# Patient Record
Sex: Female | Born: 1995 | Race: White | Hispanic: No | Marital: Single | Smoking: Current every day smoker
Health system: Southern US, Community
[De-identification: ages and names within clinical notes are randomized; demographics above are authoritative.]

## PROBLEM LIST (undated history)

## (undated) DIAGNOSIS — N390 Urinary tract infection, site not specified: Secondary | ICD-10-CM

## (undated) HISTORY — DX: Urinary tract infection, site not specified: N39.0

---
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
## 2016-07-27 ENCOUNTER — Emergency Department
Admission: EM | Admit: 2016-07-27 | Discharge: 2016-07-27 | Disposition: A | Discharge status: Home / Self Care | Payer: Self-pay | Attending: Emergency Medicine | Admitting: Emergency Medicine

## 2016-07-27 DIAGNOSIS — Z79899 Other long term (current) drug therapy: Secondary | ICD-10-CM | POA: Insufficient documentation

## 2016-07-27 DIAGNOSIS — J020 Streptococcal pharyngitis: Secondary | ICD-10-CM | POA: Insufficient documentation

## 2016-07-27 DIAGNOSIS — F17210 Nicotine dependence, cigarettes, uncomplicated: Secondary | ICD-10-CM | POA: Insufficient documentation

## 2016-07-27 LAB — POCT RAPID STREP A: STREPTOCOCCUS, GROUP A SCREEN (DIRECT): NEGATIVE

## 2016-07-27 MED ORDER — ACETAMINOPHEN 325 MG PO TABS
650.0000 mg | ORAL_TABLET | Freq: Once | ORAL | Status: AC | PRN
  Administered 2016-07-27: 650 mg via ORAL
  Filled 2016-07-27: qty 2

## 2016-07-27 MED ORDER — AMOXICILLIN 500 MG PO TABS: 500.0000 mg | ORAL_TABLET | Freq: Two times a day (BID) | ORAL | 0 refills | Status: AC

## 2016-07-27 MED ORDER — AMOXICILLIN 500 MG PO CAPS
500.0000 mg | ORAL_CAPSULE | Freq: Once | ORAL | Status: AC
  Administered 2016-07-27: 500 mg via ORAL

## 2016-07-27 NOTE — ED Provider Notes (Signed)
[facility] Emergency Department Provider Note  ____________________________________________  Time seen: Approximately 9:08 PM  I have reviewed the triage vital signs and the nursing notes.   HISTORY  Chief Complaint Sore Throat    HPI Brooke Ross is a 21 y.o. female presents to the emergency department with pharyngitis, headache, abdominal discomfort, anterior neck pain, fever and "white patches" on the back of her throat. Patient denies associated rhinorrhea, congestion, nonproductive cough, chest pain and chest tightness. Patient is managing her own secretions and speaking in complete sentences.   History reviewed. No pertinent past medical history.  Patient Active Problem List   Diagnosis Date Noted  . Open knee wound 06/24/2015    Past Surgical History:  Procedure Laterality Date  . IRRIGATION AND DEBRIDEMENT KNEE Right 06/24/2015   Procedure: IRRIGATION AND DEBRIDEMENT Open Wound Right KNEE with Local Tissue Rearrangement for Wound Closure;  Surgeon: Nadara Mustard, MD;  Location: [facility] OR;  Service: Orthopedics;  Laterality: Right;  . KNEE ARTHROSCOPY Right 06/24/2015   Procedure: RIGHT KNEE ARTHROSCOPIC WASHOUT;  Surgeon: Nadara Mustard, MD;  Location: [facility] OR;  Service: Orthopedics;  Laterality: Right;    Prior to Admission medications   Medication Sig Start Date End Date Taking? Authorizing Provider  amoxicillin (AMOXIL) 500 MG tablet Take 1 tablet (500 mg total) by mouth 2 (two) times daily. 07/27/16 08/06/16  Orvil Feil, PA-C  doxycycline (VIBRAMYCIN) 50 MG capsule Take 2 capsules (100 mg total) by mouth 2 (two) times daily. 06/24/15   Nadara Mustard, MD  ondansetron (ZOFRAN ODT) 4 MG disintegrating tablet Take 1 tablet (4 mg total) by mouth every 8 (eight) hours as needed for nausea or vomiting. 12/26/14   Rebecka Apley, MD  oxyCODONE-acetaminophen (ROXICET) 5-325 MG tablet Take 1 tablet by mouth every 4 (four) hours as needed for severe pain.  06/24/15   Nadara Mustard, MD    Allergies Patient has no known allergies.  No family history on file.  Social History Social History  Substance Use Topics  . Smoking status: Current Every Day Smoker  . Smokeless tobacco: Never Used  . Alcohol use No     Review of Systems  Constitutional: Patient has had fever Eyes: No visual changes. No discharge ENT: Patient has pharyngitis  Cardiovascular: no chest pain. Respiratory: no cough. No SOB. Gastrointestinal: No abdominal pain.  No nausea, no vomiting.  No diarrhea.  No constipation. Musculoskeletal: Negative for musculoskeletal pain. Skin: Negative for rash, abrasions, lacerations, ecchymosis. Neurological: Patient has headache, no focal weakness or numbness.  ____________________________________________   PHYSICAL EXAM:  VITAL SIGNS: ED Triage Vitals  Enc Vitals Group     BP 07/27/16 1914 134/76     Pulse Rate 07/27/16 1914 85     Resp 07/27/16 1914 16     Temp 07/27/16 1914 (!) 103.1 F (39.5 C)     Temp Source 07/27/16 1914 Oral     SpO2 07/27/16 1914 99 %     Weight 07/27/16 1912 220 lb (99.8 kg)     Height 07/27/16 1912 6' (1.829 m)     Head Circumference --      Peak Flow --      Pain Score 07/27/16 1912 8     Pain Loc --      Pain Edu? --      Excl. in GC? --      Constitutional: Alert and oriented. Well appearing and in no acute distress. Eyes: Conjunctivae are normal.  PERRL. EOMI. Head: Atraumatic. ENT:      Ears: Tympanic membranes are pearly bilaterally.      Nose: No congestion/rhinnorhea.      Mouth/Throat: Mucous membranes are moist.  Posterior pharynx is erythematous with bilateral tonsillar hypertrophy and exudate. Hematological/Lymphatic/Immunilogical: Palpable cervical lymphadenopathy. Cardiovascular: Normal rate, regular rhythm. Normal S1 and S2.  Good peripheral circulation. Respiratory: Normal respiratory effort without tachypnea or retractions. Lungs CTAB. Good air entry to the bases  with no decreased or absent breath sounds. Gastrointestinal: Bowel sounds 4 quadrants. Soft and nontender to palpation. No guarding or rigidity. No palpable masses. No distention. No CVA tenderness. Musculoskeletal: Full range of motion to all extremities. No gross deformities appreciated. Neurologic:  Normal speech and language. No gross focal neurologic deficits are appreciated.  Skin:  Skin is warm, dry and intact. No rash noted. Psychiatric: Mood and affect are normal. Speech and behavior are normal. Patient exhibits appropriate insight and judgement.   ____________________________________________   LABS (all labs ordered are listed, but only abnormal results are displayed)  Labs Reviewed  CULTURE, GROUP A STREP [facility]  POCT RAPID STREP A   ____________________________________________  EKG   ____________________________________________  RADIOLOGY   No results found.  ____________________________________________    PROCEDURES  Procedure(s) performed:    Procedures    Medications  acetaminophen (TYLENOL) tablet 650 mg (650 mg Oral Given 07/27/16 1919)     ____________________________________________   INITIAL IMPRESSION / ASSESSMENT AND PLAN / ED COURSE  Pertinent labs & imaging results that were available during my care of the patient were reviewed by me and considered in my medical decision making (see chart for details).  Review of the [place] CSRS was performed in accordance of the [facility] prior to dispensing any controlled drugs.    Assessment and plan Strep pharyngitis Patient's diagnosis is consistent with Strep pharyngitis. Patient will be discharged home with prescriptions for amoxicillin. Patient is to follow up with primary care as needed or otherwise directed. Patient is given ED precautions to return to the ED for any worsening or new symptoms.     ____________________________________________  FINAL CLINICAL IMPRESSION(S) / ED  DIAGNOSES  Final diagnoses:  Strep pharyngitis      NEW MEDICATIONS STARTED DURING THIS VISIT:  New Prescriptions   AMOXICILLIN (AMOXIL) 500 MG TABLET    Take 1 tablet (500 mg total) by mouth 2 (two) times daily.        This chart was dictated using voice recognition software/Dragon. Despite best efforts to proofread, errors can occur which can change the meaning. Any change was purely unintentional.    Gasper LloydWoods, Agam Davenport M, PA-C 07/27/16 2115    Sharman CheekStafford, Phillip, MD 07/28/16 [phone]

## 2016-07-27 NOTE — ED Triage Notes (Addendum)
Pt presents to ED with c/o sore throat since Thursday. No N/V/D, unsure of fever, did not take temp at home. While performing throat swab for Strep test, Lenord FellersYessica, EDT reports pt's tonsils are swollen with obvious "white patches".

## 2016-07-29 LAB — CULTURE, GROUP A STREP (THRC)

## 2016-12-13 ENCOUNTER — Other Ambulatory Visit: Payer: Self-pay

## 2016-12-13 ENCOUNTER — Emergency Department: Payer: Self-pay

## 2016-12-13 ENCOUNTER — Encounter: Payer: Self-pay | Admitting: Emergency Medicine

## 2016-12-13 ENCOUNTER — Emergency Department
Admission: EM | Admit: 2016-12-13 | Discharge: 2016-12-13 | Disposition: A | Discharge status: Home / Self Care | Payer: Self-pay | Attending: Emergency Medicine | Admitting: Emergency Medicine

## 2016-12-13 DIAGNOSIS — F17200 Nicotine dependence, unspecified, uncomplicated: Secondary | ICD-10-CM | POA: Insufficient documentation

## 2016-12-13 DIAGNOSIS — J181 Lobar pneumonia, unspecified organism: Secondary | ICD-10-CM | POA: Insufficient documentation

## 2016-12-13 DIAGNOSIS — J189 Pneumonia, unspecified organism: Secondary | ICD-10-CM

## 2016-12-13 MED ORDER — CEFTRIAXONE SODIUM 1 G IJ SOLR
1.0000 g | Freq: Once | INTRAMUSCULAR | Status: AC
  Administered 2016-12-13: 1 g via INTRAMUSCULAR
  Filled 2016-12-13: qty 10

## 2016-12-13 MED ORDER — BENZONATATE 200 MG PO CAPS: 200.0000 mg | ORAL_CAPSULE | Freq: Three times a day (TID) | ORAL | 0 refills | Status: DC | PRN

## 2016-12-13 MED ORDER — PSEUDOEPH-BROMPHEN-DM 30-2-10 MG/5ML PO SYRP
10.0000 mL | ORAL_SOLUTION | Freq: Four times a day (QID) | ORAL | 0 refills | Status: DC | PRN
Start: 2016-12-13 — End: 2020-08-06

## 2016-12-13 MED ORDER — AZITHROMYCIN 250 MG PO TABS: ORAL_TABLET | ORAL | 0 refills | Status: DC

## 2016-12-13 NOTE — ED Provider Notes (Signed)
[facility] Emergency Department Provider Note  ____________________________________________  Time seen: Approximately 6:52 PM  I have reviewed the triage vital signs and the nursing notes.   HISTORY  Chief Complaint Cough    HPI Brooke Ross is a 21 y.o. female who presents the emergency department for week of fevers and chills, cough, chest congestion.  Patient reports that she has had a productive cough times 1 week.  She has had some mild nasal congestion at the start of the illness but denies any headache, visual changes, nasal congestion, sore throat, abdominal pain, nausea or vomiting at this time.  Patient has taken cough drops for this cough but no other medications at home.  She denies any chest pain, shortness of breath at this time.  No other complaints at this time.  History reviewed. No pertinent past medical history.  Patient Active Problem List   Diagnosis Date Noted  . Open knee wound 06/24/2015    Past Surgical History:  Procedure Laterality Date  . IRRIGATION AND DEBRIDEMENT KNEE Right 06/24/2015   Procedure: IRRIGATION AND DEBRIDEMENT Open Wound Right KNEE with Local Tissue Rearrangement for Wound Closure;  Surgeon: Nadara MustardMarcus V Duda, MD;  Location: [facility] OR;  Service: Orthopedics;  Laterality: Right;  . KNEE ARTHROSCOPY Right 06/24/2015   Procedure: RIGHT KNEE ARTHROSCOPIC WASHOUT;  Surgeon: Nadara MustardMarcus V Duda, MD;  [facility];  Service: Orthopedics;  Laterality: Right;    Prior to Admission medications   Medication Sig Start Date End Date Taking? Authorizing Provider  azithromycin (ZITHROMAX Z-PAK) 250 MG tablet Take 2 tablets (500 mg) on  Day 1,  followed by 1 tablet (250 mg) once daily on Days 2 through 5. 12/13/16   Keyshawna Prouse, Delorise RoyalsJonathan D, PA-C  benzonatate (TESSALON) 200 MG capsule Take 1 capsule (200 mg total) by mouth 3 (three) times daily as needed for cough. 12/13/16   Midori Dado, Delorise RoyalsJonathan D, PA-C  brompheniramine-pseudoephedrine-DM  30-2-10 MG/5ML syrup Take 10 mLs by mouth 4 (four) times daily as needed. 12/13/16   Tosha Belgarde, Delorise RoyalsJonathan D, PA-C  doxycycline (VIBRAMYCIN) 50 MG capsule Take 2 capsules (100 mg total) by mouth 2 (two) times daily. 06/24/15   Nadara Mustarduda, Marcus V, MD  ondansetron (ZOFRAN ODT) 4 MG disintegrating tablet Take 1 tablet (4 mg total) by mouth every 8 (eight) hours as needed for nausea or vomiting. 12/26/14   Rebecka ApleyWebster, Allison P, MD  oxyCODONE-acetaminophen (ROXICET) 5-325 MG tablet Take 1 tablet by mouth every 4 (four) hours as needed for severe pain. 06/24/15   Nadara Mustarduda, Marcus V, MD    Allergies Patient has no known allergies.  No family history on file.  Social History Social History   Tobacco Use  . Smoking status: Current Every Day Smoker  . Smokeless tobacco: Never Used  Substance Use Topics  . Alcohol use: No  . Drug use: No     Review of Systems  Constitutional: Positive fever/chills Eyes: No visual changes. No discharge ENT: No upper respiratory complaints. Cardiovascular: no chest pain. Respiratory: Positive cough. No SOB. Gastrointestinal: No abdominal pain.  No nausea, no vomiting.  No diarrhea.  No constipation. Musculoskeletal: Negative for musculoskeletal pain. Skin: Negative for rash, abrasions, lacerations, ecchymosis. Neurological: Negative for headaches, focal weakness or numbness. 10-point ROS otherwise negative.  ____________________________________________   PHYSICAL EXAM:  VITAL SIGNS: ED Triage Vitals  Enc Vitals Group     BP 12/13/16 1808 (!) 150/94     Pulse Rate 12/13/16 1807 85     Resp 12/13/16 1807 16  Temp 12/13/16 1807 98.7 F (37.1 C)     Temp Source 12/13/16 1807 Oral     SpO2 12/13/16 1807 97 %     Weight 12/13/16 1808 220 lb (99.8 kg)     Height 12/13/16 1808 6' (1.829 m)     Head Circumference --      Peak Flow --      Pain Score --      Pain Loc --      Pain Edu? --      Excl. in GC? --      Constitutional: Alert and oriented. Well  appearing and in no acute distress. Eyes: Conjunctivae are normal. PERRL. EOMI. Head: Atraumatic. ENT:      Ears: EACs and TMs unremarkable bilaterally.      Nose: No congestion/rhinnorhea.      Mouth/Throat: Mucous membranes are moist.  Neck: No stridor.   Hematological/Lymphatic/Immunilogical: No cervical lymphadenopathy. Cardiovascular: Normal rate, regular rhythm. Normal S1 and S2.  Good peripheral circulation. Respiratory: Normal respiratory effort without tachypnea or retractions. Lungs with rhonchi to the right middle lobe.  No wheezing or rales.  No adventitious lung sounds to the left side.Peri Jefferson air entry to the bases with no decreased or absent breath sounds. Musculoskeletal: Full range of motion to all extremities. No gross deformities appreciated. Neurologic:  Normal speech and language. No gross focal neurologic deficits are appreciated.  Skin:  Skin is warm, dry and intact. No rash noted. Psychiatric: Mood and affect are normal. Speech and behavior are normal. Patient exhibits appropriate insight and judgement.   ____________________________________________   LABS (all labs ordered are listed, but only abnormal results are displayed)  Labs Reviewed - No data to display ____________________________________________  EKG   ____________________________________________  RADIOLOGY Festus Barren Corrina Steffensen, personally viewed and evaluated these images (plain radiographs) as part of my medical decision making, as well as reviewing the written report by the radiologist.  Dg Chest 2 View  Result Date: 12/13/2016 CLINICAL DATA:  Cough for 1 week. EXAM: CHEST  2 VIEW COMPARISON:  06/23/2015 FINDINGS: The cardiomediastinal silhouette is unremarkable. Mild right middle lobe opacity may represent atelectasis or airspace disease/ pneumonia. There is no evidence of pleural effusion, pulmonary edema, pneumothorax or acute bony abnormality. IMPRESSION: Mild right middle lobe opacity  -question atelectasis versus pneumonia. Electronically Signed   By: Harmon Pier M.D.   On: 12/13/2016 18:47    ____________________________________________    PROCEDURES  Procedure(s) performed:    Procedures    Medications  cefTRIAXone (ROCEPHIN) injection 1 g (not administered)     ____________________________________________   INITIAL IMPRESSION / ASSESSMENT AND PLAN / ED COURSE  Pertinent labs & imaging results that were available during my care of the patient were reviewed by me and considered in my medical decision making (see chart for details).  Review of the [place] CSRS was performed in accordance of the [facility] prior to dispensing any controlled drugs.     Patient's diagnosis is consistent with community-acquired pneumonia.  Patient has adventitious lung sounds to the right middle lobe with consolidation on x-ray consistent with bronchitis, pneumonia.  Patient will be treated with shot of Rocephin in the Z-Pak at home.  Bromfed cough syrup and Tessalon Perles also ordered for symptom control..  She will follow-up with primary care as needed.  Patient is given ED precautions to return to the ED for any worsening or new symptoms.     ____________________________________________  FINAL CLINICAL IMPRESSION(S) / ED DIAGNOSES  Final  diagnoses:  Community acquired pneumonia of right middle lobe of lung (HCC)      NEW MEDICATIONS STARTED DURING THIS VISIT:  ED Discharge Orders        Ordered    azithromycin (ZITHROMAX Z-PAK) 250 MG tablet     12/13/16 1902    brompheniramine-pseudoephedrine-DM 30-2-10 MG/5ML syrup  4 times daily PRN     12/13/16 1902    benzonatate (TESSALON) 200 MG capsule  3 times daily PRN     12/13/16 1902          This chart was dictated using voice recognition software/Dragon. Despite best efforts to proofread, errors can occur which can change the meaning. Any change was purely unintentional.    Racheal PatchesCuthriell, Jajuan Skoog D,  PA-C 12/13/16 Elayne [id number]    Siadecki, Sebastian, MD 12/13/16 2011

## 2016-12-13 NOTE — ED Triage Notes (Signed)
Pt states that for the past week she has had cough and congestion. Pt states that she is getting some yellow/green sputum up when she coughs. Pt states that she has been using cough drops without relief. Pt states that she has not checked her temperature but does not think she has had fever. Pt in NAD at this time.

## 2018-07-02 IMAGING — CR DG CHEST 2V
1 series · 2 of 2 positions shown · non-contrast
Comparison: 06/23/2015

CLINICAL DATA: Cough for 1 week.

EXAM:
CHEST  2 VIEW

[Series 1: dg chest 2 view · 0.14mm/px · 2 of 2 slices shown]
[im 1/2]
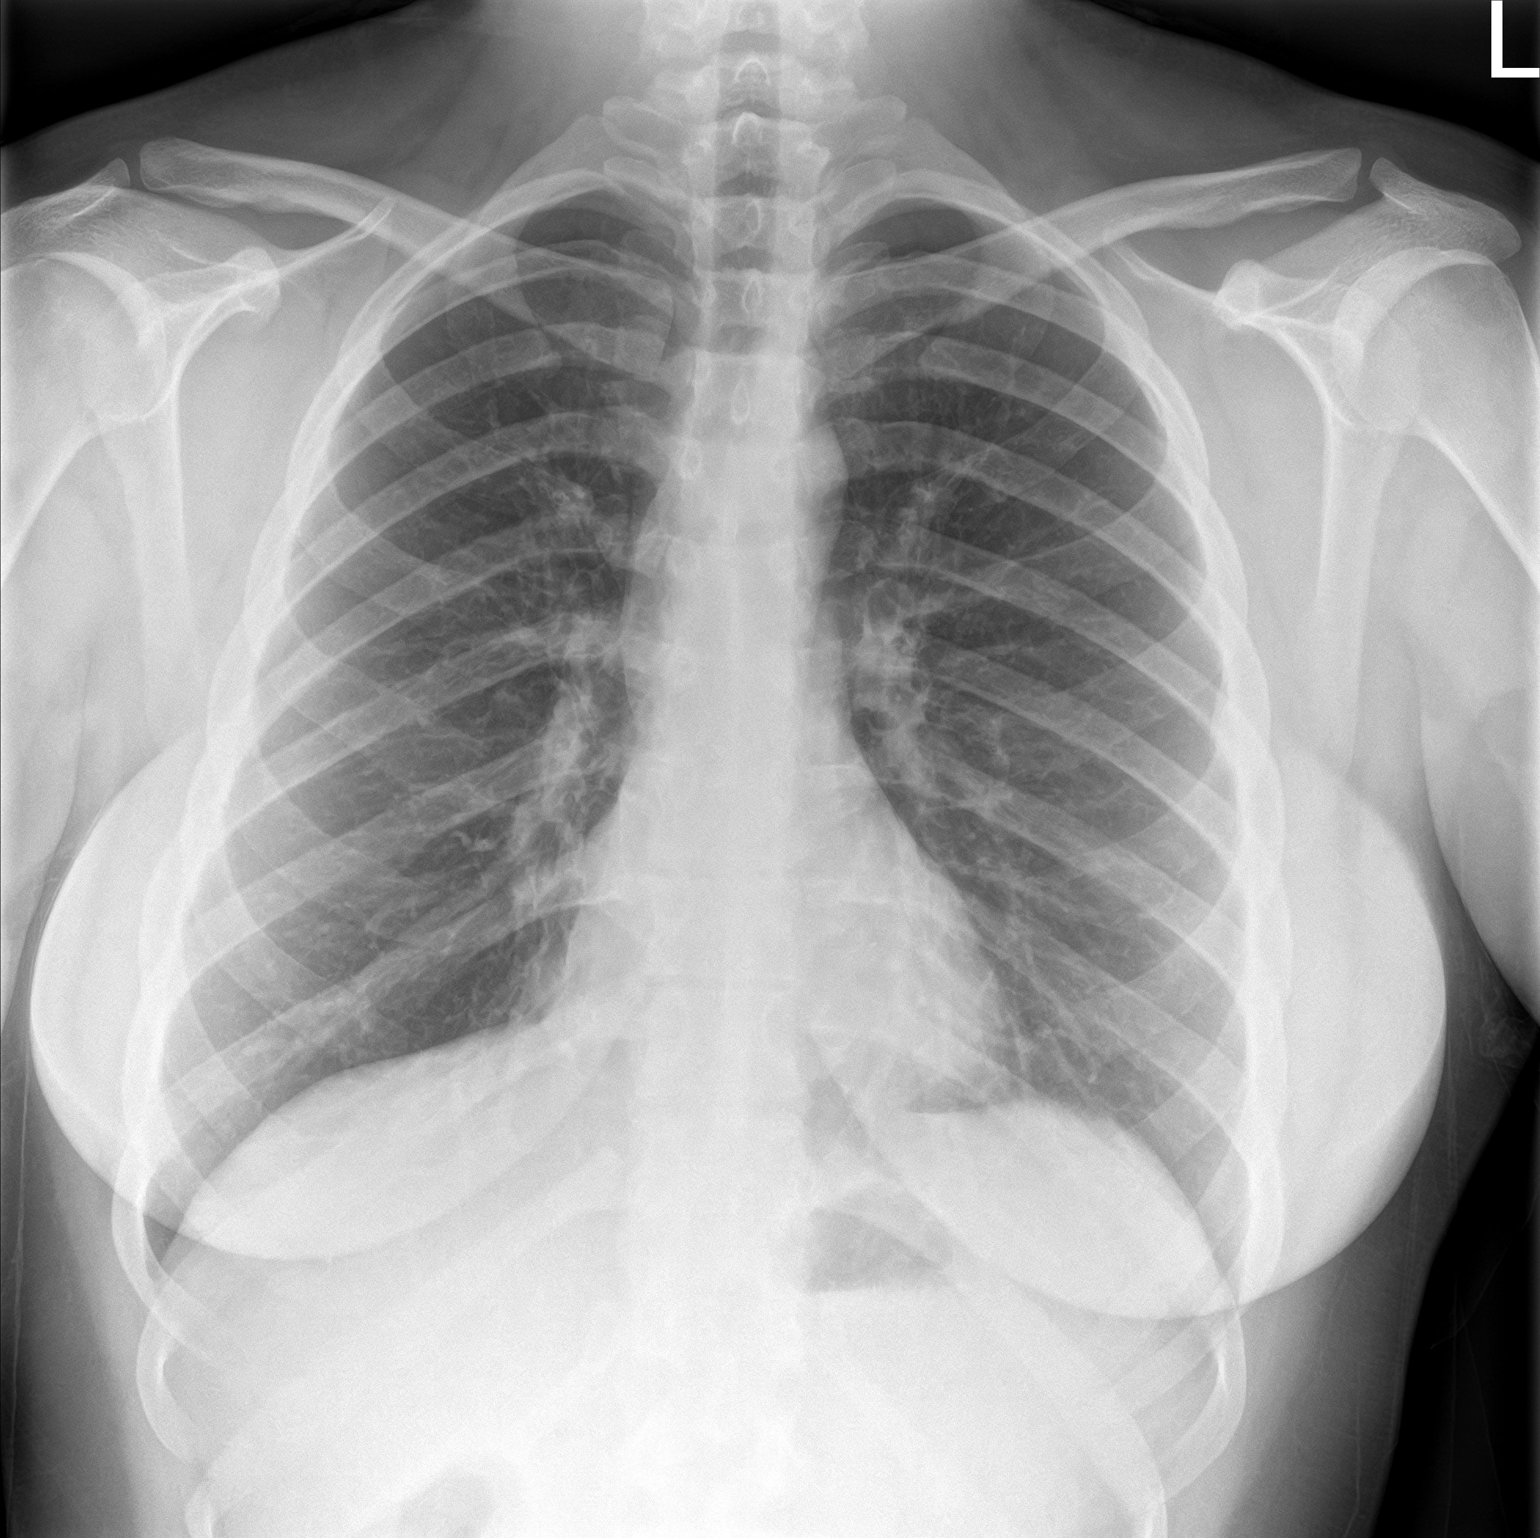
[im 2/2]
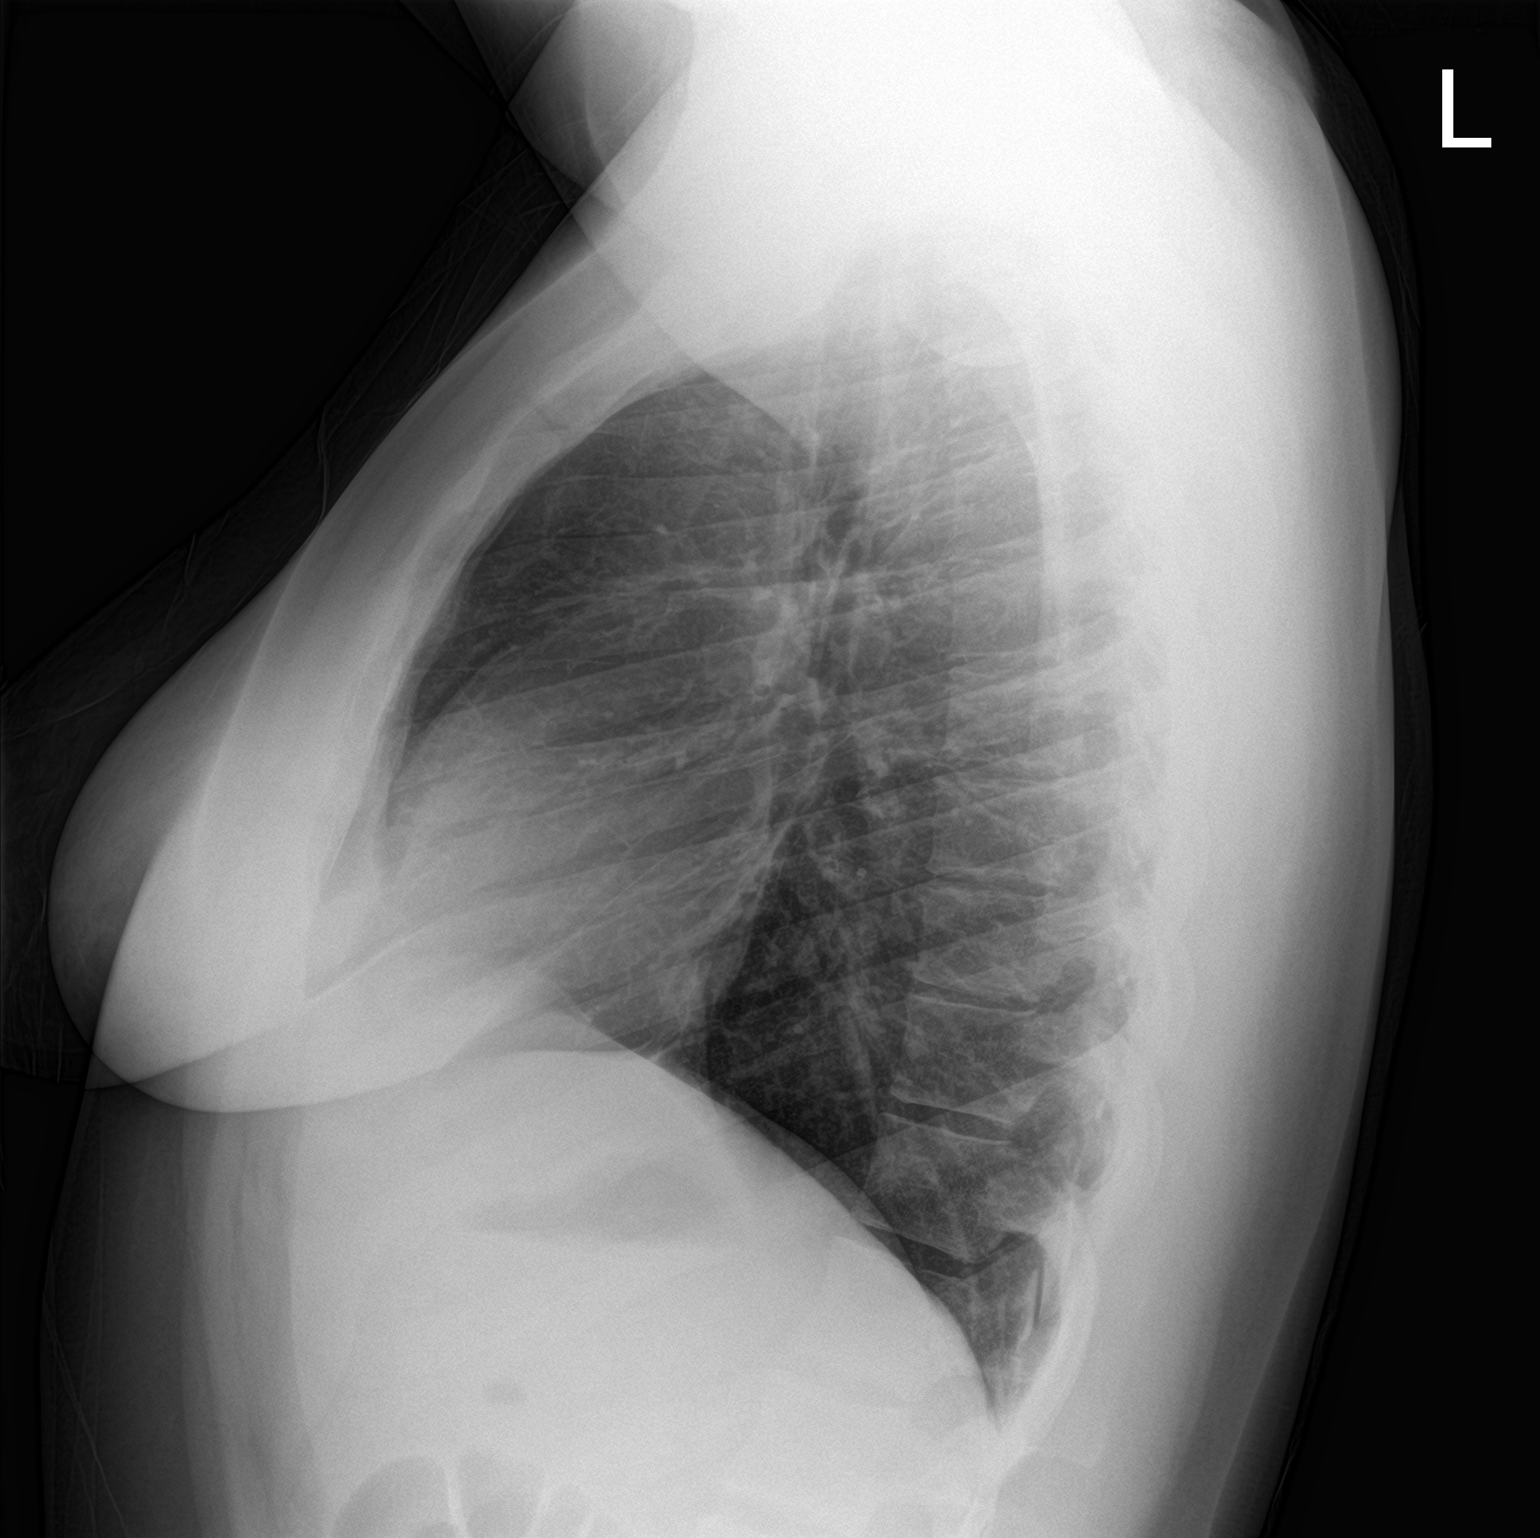

[2 of 2 positions shown; findings below may reference images not displayed]

FINDINGS: The cardiomediastinal silhouette is unremarkable.

Mild right middle lobe opacity may represent atelectasis or airspace
disease/ pneumonia.

There is no evidence of pleural effusion, pulmonary edema,
pneumothorax or acute bony abnormality.
IMPRESSION: Mild right middle lobe opacity -question atelectasis versus
pneumonia.

## 2020-06-06 ENCOUNTER — Ambulatory Visit (LOCAL_COMMUNITY_HEALTH_CENTER): Payer: Self-pay

## 2020-06-06 ENCOUNTER — Other Ambulatory Visit: Payer: Self-pay

## 2020-06-06 VITALS — BP 132/86 | Ht 71.0 in | Wt 275.5 lb

## 2020-06-06 DIAGNOSIS — Z3202 Encounter for pregnancy test, result negative: Secondary | ICD-10-CM

## 2020-06-06 LAB — PREGNANCY, URINE: Preg Test, Ur: NEGATIVE
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
## 2020-08-06 ENCOUNTER — Emergency Department
Admission: EM | Admit: 2020-08-06 | Discharge: 2020-08-06 | Disposition: A | Discharge status: Home / Self Care | Payer: Self-pay | Attending: Student in an Organized Health Care Education/Training Program | Admitting: Student in an Organized Health Care Education/Training Program

## 2020-08-06 ENCOUNTER — Other Ambulatory Visit: Payer: Self-pay

## 2020-08-06 DIAGNOSIS — F17210 Nicotine dependence, cigarettes, uncomplicated: Secondary | ICD-10-CM | POA: Insufficient documentation

## 2020-08-06 DIAGNOSIS — N911 Secondary amenorrhea: Secondary | ICD-10-CM | POA: Insufficient documentation

## 2020-08-06 DIAGNOSIS — N9489 Other specified conditions associated with female genital organs and menstrual cycle: Secondary | ICD-10-CM | POA: Insufficient documentation

## 2020-08-06 LAB — BASIC METABOLIC PANEL
Anion gap: 9 (ref 5–15)
BUN: 10 mg/dL (ref 6–20)
CO2: 27 mmol/L (ref 22–32)
Calcium: 9.5 mg/dL (ref 8.9–10.3)
Chloride: 101 mmol/L (ref 98–111)
Creatinine, Ser: 0.75 mg/dL (ref 0.44–1.00)
GFR, Estimated: >60 mL/min (ref >60)
Glucose, Bld: 82 mg/dL (ref 70–99)
Potassium: 4.2 mmol/L (ref 3.5–5.1)
Sodium: 137 mmol/L (ref 135–145)

## 2020-08-06 LAB — CBC
HCT: 40.9 % (ref 36.0–46.0)
Hemoglobin: 13.8 g/dL (ref 12.0–15.0)
MCH: 28.8 pg (ref 26.0–34.0)
MCHC: 33.7 g/dL (ref 30.0–36.0)
MCV: 85.4 fL (ref 80.0–100.0)
Platelets: 326 10*3/uL (ref 150–400)
RBC: 4.79 MIL/uL (ref 3.87–5.11)
RDW: 12.4 % (ref 11.5–15.5)
WBC: 9.3 10*3/uL (ref 4.0–10.5)
nRBC: 0 % (ref 0.0–0.2)

## 2020-08-06 LAB — HCG, QUANTITATIVE, PREGNANCY: hCG, Beta Chain, Quant, S: <1 m[IU]/mL (ref <5)

## 2020-08-06 LAB — POC URINE PREG, ED: Preg Test, Ur: NEGATIVE

## 2020-08-06 NOTE — ED Notes (Signed)
See tirage note  Presents stating her period was late    Denies any pain

## 2020-08-06 NOTE — Discharge Instructions (Addendum)
Your pregnancy labs are negative today.  Please schedule an appointment to follow-up with primary care or OB/GYN.  The information for the [facility]-door clinic is available on this discharge paperwork.  If you develop any worsening symptoms, please return to the emergency department.

## 2020-08-06 NOTE — ED Triage Notes (Signed)
Pt states she is 80 days late for her periods and home pregnancy test are negative and just wants labs drawn to make sure. Pt has no complaints

## 2020-08-06 NOTE — ED Provider Notes (Signed)
[facility] Emergency Department Provider Note  ____________________________________________   Event Date/Time   First MD Initiated Contact with Patient 08/06/20 1216     (approximate)  I have reviewed the triage vital signs and the nursing notes.   HISTORY  Chief Complaint Possible Pregnancy   HPI Ardra Kuznicki is a 25 y.o. female who reports to the ER stating she needs blood work to confirm no pregnancy. Patient states she has not had a period in the last 80 days, has had multiple negative pregnancy tests. She denies history of periods of amenorrhea. States her menstrual cycles have previously been regular intervals, but does note the last 2 cycles seemed to be much lighter than her usual. She does endorse occasional broad headaches, but states this is normal for her and has not had any recent changes. She denies any blurred vision, dizziness, nipple discharge, breast pain, abdominal pain, nausea, vomiting, dysuria or other associated symptoms. She denies any significant weight gain or weight loss, changes in diet or exercise habits. She states she has been unable to see primary care or OB regarding this concern due to financial limitations. She is not on any contraceptives.         Past Medical History:  Diagnosis Date   Urinary tract infection     Patient Active Problem List   Diagnosis Date Noted   Open knee wound 06/24/2015    Past Surgical History:  Procedure Laterality Date   IRRIGATION AND DEBRIDEMENT KNEE Right 06/24/2015   Procedure: IRRIGATION AND DEBRIDEMENT Open Wound Right KNEE with Local Tissue Rearrangement for Wound Closure;  Surgeon: Nadara Mustard, MD;  Location: [facility] OR;  Service: Orthopedics;  Laterality: Right;   KNEE ARTHROSCOPY Right 06/24/2015   Procedure: RIGHT KNEE ARTHROSCOPIC WASHOUT;  Surgeon: Nadara Mustard, MD;  Location: [facility] OR;  Service: Orthopedics;  Laterality: Right;    Prior to Admission medications   Not on File     Allergies Patient has no known allergies.  No family history on file.  Social History Social History   Tobacco Use   Smoking status: Every Day    Packs/day: 0.50    Types: Cigarettes   Smokeless tobacco: Never  Vaping Use   Vaping Use: Every day   Substances: Nicotine  Substance Use Topics   Alcohol use: Not Currently    Comment: last use 01/12/20   Drug use: No    Review of Systems Constitutional: No fever/chills Eyes: No visual changes. ENT: No sore throat. Cardiovascular: Denies chest pain. Respiratory: Denies shortness of breath. Gastrointestinal: No abdominal pain.  No nausea, no vomiting.  No diarrhea.  No constipation. Genitourinary: Negative for dysuria. +amenorrhea Musculoskeletal: Negative for back pain. Skin: Negative for rash. Neurological: +chronic headaches, negative for focal weakness or numbness.  ____________________________________________   PHYSICAL EXAM:  VITAL SIGNS: ED Triage Vitals  Enc Vitals Group     BP 08/06/20 1150 (!) 134/92     Pulse Rate 08/06/20 1150 64     Resp 08/06/20 1150 18     Temp 08/06/20 1150 97.7 F (36.5 C)     Temp Source 08/06/20 1150 Oral     SpO2 08/06/20 1150 99 %     Weight 08/06/20 1231 275 lb 9.2 oz (125 kg)     Height 08/06/20 1125 5\' 11"  (1.803 m)     Head Circumference --      Peak Flow --      Pain Score 08/06/20 1125 0  Pain Loc --      Pain Edu? --      Excl. in GC? --    Constitutional: Alert and oriented. Well appearing and in no acute distress. Eyes: Conjunctivae are normal. PERRL. EOMI. Head: Atraumatic. Nose: No congestion/rhinnorhea. Mouth/Throat: Mucous membranes are moist.  Oropharynx non-erythematous. Neck: No stridor.   Cardiovascular: Normal rate, regular rhythm. Grossly normal heart sounds.  Good peripheral circulation. Respiratory: Normal respiratory effort.  No retractions. Lungs CTAB. Gastrointestinal: Soft and nontender. No distention. No abdominal bruits. No CVA  tenderness. Musculoskeletal: No lower extremity tenderness nor edema.  No joint effusions. Neurologic:  Normal speech and language. CN II-XII grossly intact. No gross focal neurologic deficits are appreciated. No gait instability. Skin:  Skin is warm, dry and intact. No rash noted. Psychiatric: Mood and affect are normal. Speech and behavior are normal.  ____________________________________________   LABS (all labs ordered are listed, but only abnormal results are displayed)  Labs Reviewed  HCG, QUANTITATIVE, PREGNANCY  CBC  BASIC METABOLIC PANEL  POC URINE PREG, ED     ____________________________________________   INITIAL IMPRESSION / ASSESSMENT AND PLAN / ED COURSE  As part of my medical decision making, I reviewed the following data within the electronic MEDICAL RECORD NUMBER Nursing notes reviewed and incorporated, Labs reviewed, and Notes from prior ED visits        Patient is a 25 yo female who reports to the ER for evaluation of amenorrhea over the last 80 days and requesting blood confirmation of no pregnancy. See HPI for further details. In triage, patient has grossly normal vital signs. PE as above, with no significant findings. Labs were obtained including CBC, BMP, Quant HCG which are all within normal limits. Discussed findings with the patient. At this time, feel the patient is appropriate for outpatient follow up regarding this secondary amenorrhea. Referral resources to [facility] were provided. Return precautions discussed, patient stable for outpatient follow up.       ____________________________________________   FINAL CLINICAL IMPRESSION(S) / ED DIAGNOSES  Final diagnoses:  Secondary amenorrhea     ED Discharge Orders     None        Note:  This document was prepared using Dragon voice recognition software and may include unintentional dictation errors.    Lucy Chris, PA 08/07/20 1125    Willy Eddy, MD 08/07/20 [phone]

## 2021-06-05 ENCOUNTER — Emergency Department
Admission: EM | Admit: 2021-06-05 | Discharge: 2021-06-05 | Disposition: A | Discharge status: Home / Self Care | Payer: Self-pay | Attending: Emergency Medicine | Admitting: Emergency Medicine

## 2021-06-05 ENCOUNTER — Other Ambulatory Visit: Payer: Self-pay

## 2021-06-05 ENCOUNTER — Emergency Department: Payer: Self-pay

## 2021-06-05 DIAGNOSIS — F17210 Nicotine dependence, cigarettes, uncomplicated: Secondary | ICD-10-CM | POA: Insufficient documentation

## 2021-06-05 DIAGNOSIS — J189 Pneumonia, unspecified organism: Secondary | ICD-10-CM | POA: Insufficient documentation

## 2021-06-05 DIAGNOSIS — Z20822 Contact with and (suspected) exposure to covid-19: Secondary | ICD-10-CM | POA: Insufficient documentation

## 2021-06-05 LAB — RESP PANEL BY RT-PCR (FLU A&B, COVID) ARPGX2
Influenza A by PCR: NEGATIVE
Influenza B by PCR: NEGATIVE
SARS Coronavirus 2 by RT PCR: NEGATIVE

## 2021-06-05 MED ORDER — AMOXICILLIN-POT CLAVULANATE 875-125 MG PO TABS: 1.0000 | ORAL_TABLET | Freq: Two times a day (BID) | ORAL | 0 refills | Status: AC

## 2021-06-05 MED ORDER — IPRATROPIUM-ALBUTEROL 0.5-2.5 (3) MG/3ML IN SOLN
3.0000 mL | Freq: Once | RESPIRATORY_TRACT | Status: AC
  Administered 2021-06-05: 3 mL via RESPIRATORY_TRACT
  Filled 2021-06-05: qty 3

## 2021-06-05 MED ORDER — ALBUTEROL SULFATE HFA 108 (90 BASE) MCG/ACT IN AERS: 2.0000 | INHALATION_SPRAY | Freq: Four times a day (QID) | RESPIRATORY_TRACT | 2 refills | Status: DC | PRN

## 2021-06-05 NOTE — ED Notes (Signed)
Sig pad not working. Pt verbalizes understanding of d/c instructions. Denies questions or concerns. Nad noted. Ambulatory

## 2021-06-05 NOTE — Discharge Instructions (Addendum)
Follow-up with your primary care provider, urgent care or return to the emergency department if any worsening of your symptoms.  You may take Tylenol or ibuprofen if needed for headache, body aches or fever.  Antibiotic and an albuterol inhaler was sent to the pharmacy.  Make sure that she take all the antibiotic until completely finished.  Decrease the amount of smoking that you are doing at this time.

## 2021-06-05 NOTE — ED Provider Notes (Signed)
[facility] Provider Note    Event Date/Time   First MD Initiated Contact with Patient 06/05/21 1009     (approximate)   History   URI   HPI  Myha Fouts is a 26 y.o. female   presents to the ED with complaint of cough, congestion without fever or chills.  Patient states that she is a smoker at half pack cigarettes per day and has had a history of bronchitis.  Patient states that coughing makes her symptoms worse.  Patient has a history of right knee arthroscopy but otherwise is negative.      Physical Exam   Triage Vital Signs: ED Triage Vitals [06/05/21 1005]  Enc Vitals Group     BP 137/80     Pulse Rate 93     Resp 18     Temp 98.2 F (36.8 C)     Temp Source Oral     SpO2 96 %     Weight      Height      Head Circumference      Peak Flow      Pain Score      Pain Loc      Pain Edu?      Excl. in GC?     Most recent vital signs: Vitals:   06/05/21 1005  BP: 137/80  Pulse: 93  Resp: 18  Temp: 98.2 F (36.8 C)  SpO2: 96%     General: Awake, no distress.  Alert, talkative. CV:  Good peripheral perfusion.  Heart regular rate and rhythm. Resp:  Normal effort.  Lungs with occasional expiratory wheeze noted more on the right than the left. Abd:  No distention.  Other:     ED Results / Procedures / Treatments   Labs (all labs ordered are listed, but only abnormal results are displayed) Labs Reviewed  RESP PANEL BY RT-PCR (FLU A&B, COVID) ARPGX2      RADIOLOGY Chest x-ray images were reviewed by me and interpreted as increased markings bilaterally.  Radiology report suggest either atelectasis or pneumonia.    PROCEDURES:  Critical Care performed:   Procedures   MEDICATIONS ORDERED IN ED: Medications  ipratropium-albuterol (DUONEB) 0.5-2.5 (3) MG/3ML nebulizer solution 3 mL (3 mLs Nebulization Given 06/05/21 1205)     IMPRESSION / MDM / ASSESSMENT AND PLAN / ED COURSE  I reviewed the triage vital signs and  the nursing notes.   Differential diagnosis includes, but is not limited to, influenza, COVID, bronchitis, pneumonia, viral respiratory illness.  26 year old female presents to the ED with complaint of cough and congestion for approximately 2 days.  Patient has a history of smoking and bronchitis in the past.  On exam it was noted that she had an occasional expiratory wheeze but no shortness of breath and her O2 sat was 96%.  COVID, influenza test were negative.  Chest xray reviewed and interpreted by myself and radiology with more inclination to treat her for pneumonia based on her symptoms.  Patient was given a DuoNeb treatment while in the ED.  An albuterol inhaler prescription and Augmentin was sent to the pharmacy and patient is aware that she needs to take the antibiotic until completely finished.  She is to follow-up with her PCP if any continued problems.  She knows that she needs to return to the emergency department if any worsening of her symptoms.        FINAL CLINICAL IMPRESSION(S) / ED DIAGNOSES   Final diagnoses:  Community acquired pneumonia, unspecified laterality     Rx / DC Orders   ED Discharge Orders          Ordered    albuterol (VENTOLIN HFA) 108 (90 Base) MCG/ACT inhaler  Every 6 hours PRN        06/05/21 1200    amoxicillin-clavulanate (AUGMENTIN) 875-125 MG tablet  2 times daily        06/05/21 1200             Note:  This document was prepared using Dragon voice recognition software and may include unintentional dictation errors.   Tommi Rumps, PA-C 06/05/21 1317    Concha Se, MD 06/05/21 [phone]

## 2021-06-05 NOTE — ED Triage Notes (Signed)
Pt c/o cough with sinus and chest congestion since yesterday. Pt is in NAD on arrival 

## 2021-06-05 NOTE — ED Notes (Signed)
See triage note  presents with cough and nasal congestion which started yesterday  having some discomfort in chest denies any fever

## 2021-10-08 ENCOUNTER — Other Ambulatory Visit: Payer: Self-pay

## 2021-10-08 ENCOUNTER — Emergency Department
Admission: EM | Admit: 2021-10-08 | Discharge: 2021-10-08 | Disposition: A | Discharge status: Home / Self Care | Payer: Self-pay | Attending: Emergency Medicine | Admitting: Emergency Medicine

## 2021-10-08 ENCOUNTER — Emergency Department: Payer: Self-pay

## 2021-10-08 DIAGNOSIS — Z20822 Contact with and (suspected) exposure to covid-19: Secondary | ICD-10-CM | POA: Insufficient documentation

## 2021-10-08 DIAGNOSIS — F17200 Nicotine dependence, unspecified, uncomplicated: Secondary | ICD-10-CM | POA: Insufficient documentation

## 2021-10-08 DIAGNOSIS — J40 Bronchitis, not specified as acute or chronic: Secondary | ICD-10-CM | POA: Insufficient documentation

## 2021-10-08 LAB — RESP PANEL BY RT-PCR (FLU A&B, COVID) ARPGX2
Influenza A by PCR: NEGATIVE
Influenza B by PCR: NEGATIVE
SARS Coronavirus 2 by RT PCR: NEGATIVE

## 2021-10-08 LAB — CBC
HCT: 37.1 % (ref 36.0–46.0)
Hemoglobin: 12.2 g/dL (ref 12.0–15.0)
MCH: 28.3 pg (ref 26.0–34.0)
MCHC: 32.9 g/dL (ref 30.0–36.0)
MCV: 86.1 fL (ref 80.0–100.0)
Platelets: 271 10*3/uL (ref 150–400)
RBC: 4.31 MIL/uL (ref 3.87–5.11)
RDW: 12.7 % (ref 11.5–15.5)
WBC: 10.1 10*3/uL (ref 4.0–10.5)
nRBC: 0 % (ref 0.0–0.2)

## 2021-10-08 LAB — BASIC METABOLIC PANEL
Anion gap: 9 (ref 5–15)
BUN: 12 mg/dL (ref 6–20)
CO2: 25 mmol/L (ref 22–32)
Calcium: 8.9 mg/dL (ref 8.9–10.3)
Chloride: 105 mmol/L (ref 98–111)
Creatinine, Ser: 0.85 mg/dL (ref 0.44–1.00)
GFR, Estimated: >60 mL/min (ref >60)
Glucose, Bld: 108 mg/dL — ABNORMAL HIGH (ref 70–99)
Potassium: 4.3 mmol/L (ref 3.5–5.1)
Sodium: 139 mmol/L (ref 135–145)

## 2021-10-08 LAB — TROPONIN I (HIGH SENSITIVITY): Troponin I (High Sensitivity): 2 ng/L (ref <18)

## 2021-10-08 MED ORDER — ALBUTEROL SULFATE HFA 108 (90 BASE) MCG/ACT IN AERS: 2.0000 | INHALATION_SPRAY | Freq: Four times a day (QID) | RESPIRATORY_TRACT | 2 refills | Status: AC | PRN

## 2021-10-08 MED ORDER — ALBUTEROL SULFATE (2.5 MG/3ML) 0.083% IN NEBU
3.0000 mL | INHALATION_SOLUTION | Freq: Once | RESPIRATORY_TRACT | Status: AC
Start: 2021-10-08 — End: 2021-10-08
  Administered 2021-10-08: 3 mL via RESPIRATORY_TRACT
  Filled 2021-10-08: qty 3

## 2021-10-08 MED ORDER — PREDNISONE 20 MG PO TABS
40.0000 mg | ORAL_TABLET | Freq: Every day | ORAL | 0 refills | Status: AC
Start: 2021-10-08 — End: 2021-10-13

## 2021-10-08 MED ORDER — PREDNISONE 20 MG PO TABS
40.0000 mg | ORAL_TABLET | Freq: Once | ORAL | Status: AC
  Administered 2021-10-08: 40 mg via ORAL
  Filled 2021-10-08: qty 2

## 2021-10-08 NOTE — ED Triage Notes (Signed)
Pt here with SOB that started last night. Pt states she has pain with inspiration. Pt also has cough, congestion, and chest tightness.

## 2021-10-08 NOTE — ED Provider Notes (Signed)
[facility] Provider Note    Event Date/Time   First MD Initiated Contact with Patient 10/08/21 1506     (approximate)   History   Shortness of Breath   HPI  Zayli Meda is a 26 y.o. female reports no major past medical history other than active smoker, had pneumonia once in the past  For the last 2 days has had runny nose nasal congestion dry cough and slight wheezing off-and-on.  Feels like her chest just feels just slightly tight at times.  Does not feel significantly short of breath but at times feels like a slight tightness with some mild wheezing along with a coughing  No nausea or vomiting.  Still eating and drinking well.  No lightheadedness.  No leg swelling.  Denies pregnancy.     Physical Exam   Triage Vital Signs: ED Triage Vitals  Enc Vitals Group     BP 10/08/21 1447 128/85     Pulse Rate 10/08/21 1447 85     Resp 10/08/21 1447 20     Temp 10/08/21 1447 97.9 F (36.6 C)     Temp Source 10/08/21 1447 Oral     SpO2 10/08/21 1447 96 %     Weight 10/08/21 1448 275 lb 9.2 oz (125 kg)     Height 10/08/21 1448 5\' 11"  (1.803 m)     Head Circumference --      Peak Flow --      Pain Score 10/08/21 1448 7     Pain Loc --      Pain Edu? --      Excl. in GC? --     Most recent vital signs: Vitals:   10/08/21 1447  BP: 128/85  Pulse: 85  Resp: 20  Temp: 97.9 F (36.6 C)  SpO2: 96%     General: Awake, no distress.  Sitting up, very pleasant.  Normal work of breathing.  No noted wheezing CV:  Good peripheral perfusion.  Normal heart tones and rate Resp:  Normal effort.  Clear bilateral.  With deep breathing she has a dry nonproductive cough.  She does not have any evidence of respiratory distress.  Speaks in full clear sentences.  Patient reports that when she arrived to the ER the triage nurse had told her she did hear some wheezing, at this point I do not Abd:  No distention.  Soft Other:  No lower extremity edema   ED  Results / Procedures / Treatments   Labs (all labs ordered are listed, but only abnormal results are displayed) Labs Reviewed  BASIC METABOLIC PANEL - Abnormal; Notable for the following components:      Result Value   Glucose, Bld 108 (*)    All other components within normal limits  RESP PANEL BY RT-PCR (FLU A&B, COVID) ARPGX2  CBC  TROPONIN I (HIGH SENSITIVITY)     EKG  And interpreted by me at 1500 heart rate 75 QRS 90 QTc 430 Normal sinus rhythm no evidence of ischemia   RADIOLOGY Chest x-ray interpreted by me as negative for acute    PROCEDURES:  Critical Care performed: No  Procedures   MEDICATIONS ORDERED IN ED: Medications  albuterol (PROVENTIL) (2.5 MG/3ML) 0.083% nebulizer solution 3 mL (has no administration in time range)  predniSONE (DELTASONE) tablet 40 mg (has no administration in time range)     IMPRESSION / MDM / ASSESSMENT AND PLAN / ED COURSE  I reviewed the triage vital signs and the nursing  notes.                              Differential diagnosis includes, but is not limited to, upper respiratory infection bronchitis influenza COVID-19 all high in the differential, unlikely to represent pneumonia as she has a normal white count no fever no infiltrates on imaging.  Lung sounds are clear.  Work of breathing is normal at this time.  She does report intermittently wheezing and is a smoker.  We will treat her as suspected Truman Hayward having mild episodes of bronchospasm.  EKG reassuring normal troponin symptoms present for about a day and a half, I do not feel a second troponin is needed.  No notable risk factors for acute thromboembolic process no significant pleuritic chest pain no tachycardia no hypoxia clinical symptoms match up with upper respiratory infection  Patient's presentation is most consistent with acute complicated illness / injury requiring diagnostic workup.    Discussed with patient paper prescription for return precautions.  She is  agreeable with utilizing a albuterol inhaler as needed and prednisone which she reports she has used both in the past.  Return precautions and treatment recommendations and follow-up discussed with the patient who is agreeable with the plan.     FINAL CLINICAL IMPRESSION(S) / ED DIAGNOSES   Final diagnoses:  Bronchitis     Rx / DC Orders   ED Discharge Orders          Ordered    albuterol (VENTOLIN HFA) 108 (90 Base) MCG/ACT inhaler  Every 6 hours PRN        10/08/21 1553    predniSONE (DELTASONE) 20 MG tablet  Daily with breakfast        10/08/21 1553             Note:  This document was prepared using Dragon voice recognition software and may include unintentional dictation errors.   Delman Kitten, MD 10/08/21 [phone]

## 2021-10-08 NOTE — ED Provider Triage Note (Signed)
Emergency Medicine Provider Triage Evaluation Note  Brooke Ross , a 26 y.o. female  was evaluated in triage.  Pt complains of cough, congestion, shortness of breath and wheezing.  Review of Systems  Positive: Cough, shortness of breath Negative:   Physical Exam  There were no vitals taken for this visit. Gen:   Awake, no distress   Resp:  Normal effort, minimal wheezing noted on auscultation MSK:   Moves extremities without difficulty  Other:    Medical Decision Making  Medically screening exam initiated at 2:45 PM.  Appropriate orders placed.  Brooke Ross was informed that the remainder of the evaluation will be completed by another provider, this initial triage assessment does not replace that evaluation, and the importance of remaining in the ED until their evaluation is complete.  Shortness of breath protocols along with COVID test ordered   Versie Starks, PA-C 10/08/21 1445

## 2022-12-23 IMAGING — CR DG CHEST 2V
2 series · 2 of 2 positions shown · non-contrast
Comparison: Chest radiograph 12/13/2016.

CLINICAL DATA: cough

EXAM:
CHEST - 2 VIEW

[chest pa]
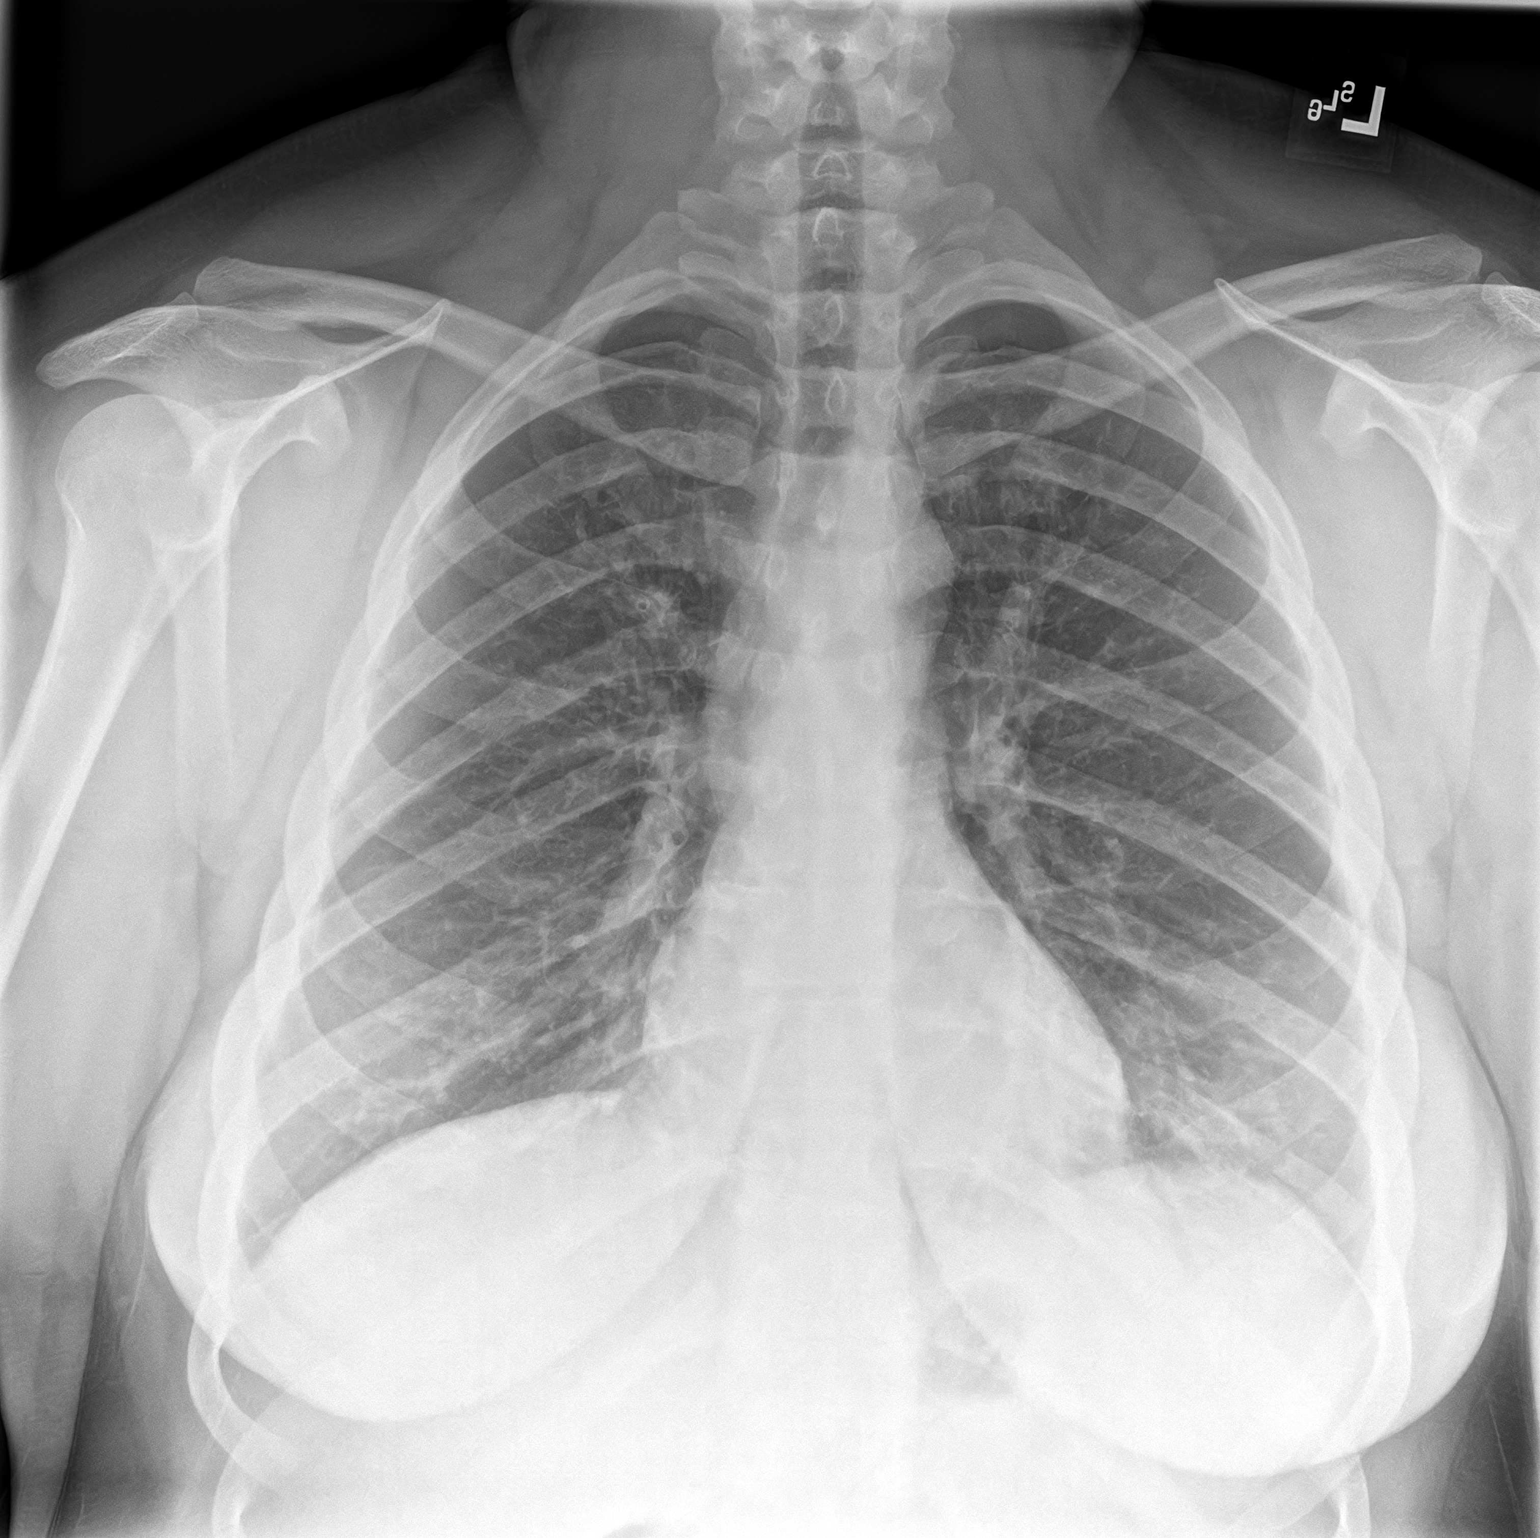

[chest lat]
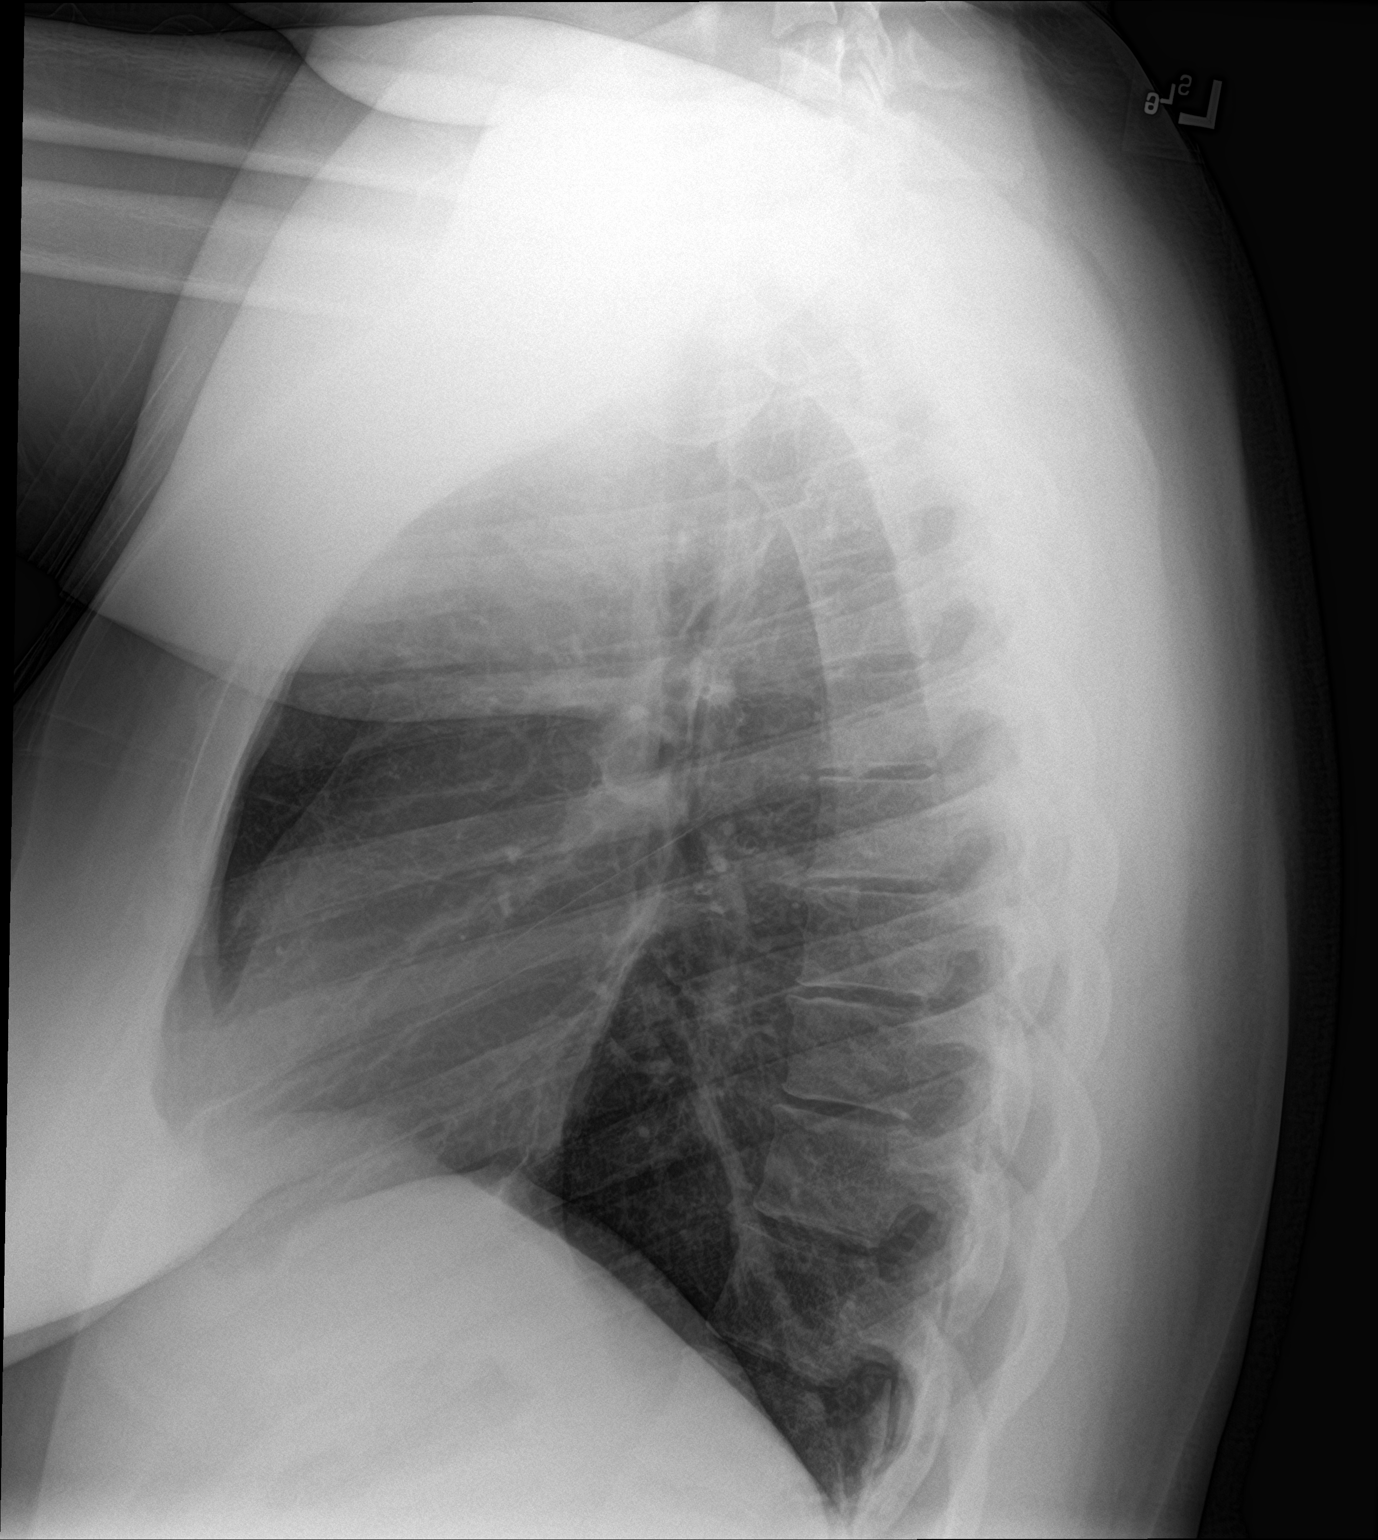

[2 of 2 positions shown; findings below may reference images not displayed]

FINDINGS: Mild bibasilar opacities. No visible pleural effusions or
pneumothorax. Cardiomediastinal silhouette is within normal limits.
No evidence of acute osseous abnormality.
IMPRESSION: Mild bibasilar opacities, which could represent atelectasis and/or
pneumonia.
